# Patient Record
Sex: Male | Born: 1965 | Race: White | Hispanic: No | Marital: Married | Smoking: Never smoker
Health system: Southern US, Community
[De-identification: ages and names within clinical notes are randomized; demographics above are authoritative.]

## PROBLEM LIST (undated history)

## (undated) DIAGNOSIS — M069 Rheumatoid arthritis, unspecified: Secondary | ICD-10-CM

## (undated) HISTORY — DX: Rheumatoid arthritis, unspecified: M06.9

---

## 2007-09-06 HISTORY — PX: ELBOW SURGERY: SHX618

## 2008-01-31 ENCOUNTER — Ambulatory Visit (HOSPITAL_BASED_OUTPATIENT_CLINIC_OR_DEPARTMENT_OTHER): Admission: RE | Admit: 2008-01-31 | Discharge: 2008-01-31 | Payer: Self-pay | Admitting: Orthopedic Surgery

## 2008-01-31 ENCOUNTER — Encounter (INDEPENDENT_AMBULATORY_CARE_PROVIDER_SITE_OTHER): Payer: Self-pay | Admitting: Orthopedic Surgery

## 2011-01-18 NOTE — Op Note (Signed)
NAME:  LENNYN, BELLANCA NO.:  [id number]   MEDICAL RECORD NO.:  [id number]          PATIENT TYPE:  AMB   LOCATION:  DSC                          FACILITY:  [facility]   PHYSICIAN:  Katy Fitch. Sypher, M.D. 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   OPERATION:  1. Resection of large subcutaneous rheumatoid nodule and olecranon      bursa, right elbow.  2. Through separate incision, removal of large olecranon bursa and      rheumatoid nodule, left elbow.   OPERATING SURGEON:  Katy Fitch. Sypher, MD   ASSISTANT:  Marveen Reeks. Dasnoit, PA-C.   ANESTHESIA:  General by LMA.   SUPERVISING ANESTHESIOLOGIST:  Zenon Mayo, MD   INDICATIONS:  Knight Oelkers is a 45 year old gentleman referred through  courtesy of Dr. Syliva Overman for evaluation and management of massive  rheumatoid nodules involving the olecranon bursae bilaterally.   On the right, he had a near softball-sized bursa and rheumatoid nodule  and on the left, he had a nodule, nearly the size of an apple.   This rendered very challenging for Mr. Shave to lean against surfaces  with his arms.   He was on anti-inflammatory medication from Dr. Jimmy Footman, including  Plaquenil and methotrexate.  Overall, his arthritis symptoms were well  controlled on this regimen.   Due to the difficulty with him leaning against his elbows, another  functional impairment, he was brought to the operating room at this time  for removal of his bilateral rheumatoid nodules.   Preoperatively, he was advised of the pathophysiology of these lesions.  He understands that there is a significant chance of recurrence.  Our  goal today is to debulk these and to improve his arm function.   PROCEDURE:  Tanmay Halteman was brought to the operating room and was placed  in supine position up on the operating table.   Following the induction of general anesthesia by LMA technique, the  right and left arms were prepped with Betadine soap solution, sterilely  draped.  A pneumatic tourniquet was applied in the proximal right  brachium and the proximal left brachium.  On exsanguination of the right  arm with an Esmarch bandage and arterial tourniquet, the proximal  brachium was inflated to 130 mmHg.  The procedure commenced with  planning of a 15-cm incision with intention to remove approximately 10  cm2 of skin due to the tissue expansion nature of this large nodules.   The skin was carefully undermined with  a fine tenotomy scissors  developing a plane between the nodule and the dermis.  These were  circumferentially dissected to the fascia overlying the extensor  musculature, triceps, and flexor carpi ulnaris.  The rheumatoid nodules  removed on block and the entire olecranon bursa was removed down to the  level of periosteum of the olecranon.   Bleeding points were electrocauterized with bipolar current under saline  followed by release of the tourniquet and hemostasis.   The wounds were then repaired with subcutaneous suture of 4-0 Vicryl and  intradermal through a Prolene segmental suture with Steri-Strips.  2%  lidocaine was infiltrated postop analgesia.   A compressive dressing was applied with sterile gauze and Tegaderm  followed by Ace wrap for compression.   Attention was then directed to the left arm.  The arm was exsanguinated  with  Esmarch bandage and an arterial tourniquet on the proximal brachium  was inflated to 230 mmHg.  A similar incision was created removing  approximately 8 cm2 of skin to decrease the volume of redundant skin due  to the tissue expansion.   The nodules were dissected between the dermis and the pseudocapsule.  The entire nodule was taken off on block with a portion of the  periosteum overlying the olecranon and the entire olecranon bursa.  Care  was taken to identify the ulnar nerve prior to completion of the medial  dissection.   The wounds were then inspected for bleeding points, which were  electrocauterized under saline followed by repair of the skin with  subcutaneous suture of 4-0 Vicryl and intradermal 3-0 Prolene segmental  sutures.  Steri-Strips were applied followed by infiltration of 2%  lidocaine for postoperative analgesia.   The wounds were then dressed with sterile gauze and Tegaderm followed by  application of an Ace wrap.  There were no apparent complications.   For aftercare, Mr. Schloemer was provided prescription for Percocet 5 mg, 1-2  tablets p.o. q.4-6 hours p.r.n. pain, 30 tablets without refill and also  Keflex 500 mg one p.o. q.8 hours x4 days as a prophylactic antibiotic.      Katy Fitch Sypher, M.D.  Electronically Signed     RVS/MEDQ  D:  01/31/2008  T:  02/01/2008  Job:  [id number]   cc:   Lemmie Evens, M.D.

## 2013-11-01 ENCOUNTER — Ambulatory Visit (INDEPENDENT_AMBULATORY_CARE_PROVIDER_SITE_OTHER): Payer: BC Managed Care – PPO | Admitting: Sports Medicine

## 2013-11-01 ENCOUNTER — Encounter: Payer: Self-pay | Admitting: Sports Medicine

## 2013-11-01 ENCOUNTER — Ambulatory Visit
Admission: RE | Admit: 2013-11-01 | Discharge: 2013-11-01 | Disposition: A | Discharge status: Home / Self Care | Payer: BC Managed Care – PPO | Source: Ambulatory Visit | Attending: Sports Medicine | Admitting: Sports Medicine

## 2013-11-01 VITALS — BP 151/88 | HR 51 | Ht 73.0 in | Wt 215.0 lb

## 2013-11-01 DIAGNOSIS — M25562 Pain in left knee: Secondary | ICD-10-CM | POA: Insufficient documentation

## 2013-11-01 DIAGNOSIS — M25569 Pain in unspecified knee: Secondary | ICD-10-CM

## 2013-11-01 NOTE — Progress Notes (Signed)
Subjective:    Patient ID: Adam Patton, male    DOB: 02-Oct-1965, 49 y.o.   MRN: [id number]  HPI: Adam Patton is a 48yo male runner who presents to [facility] for new evaluation of left medial knee pain for about 3 months (started November 2014). Pt states he noticed medial knee soreness after a run without specific injury around that time. Pt recounts left Achilles sprain in November of 2013 (a year before his current knee pain), which he managed with taking time off from running for about 2 months, as well as massage and stretching therapy, all of which helped. He similarly took about two months off of running when his knee pain started but his knee pain recurred when he tried to start running again twice in January. His pain is described as a pulling, sharp tightness along the inside of his knee and is worse with activity and with pushing off; it has gotten worse to the point that he has had pain with even walking over the last few weeks, especially when walking up an incline. Stretching and massage has helped his pain only temporarily and he has not been able to run. He can bike (normal and stationary) without any knee pain. No swelling or locking of the knee.  Of note, pt has a hx of rheumatoid arthritis diagnosed in his 20's, which is stable and well controlled on Arthrotec, methotrexate, and Plaquenil. He sees Dr. Dierdre Forth (rheumatology) regularly. He has never had knee pain and his current pain as above is very different than the typical stiff soreness he has had in his hands when he has tried to cut back on medication in the past.  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  In addition to the above documentation, pt's PMH, surgical history, FH, and SH all reviewed and updated where appropriate  in the EMR. I have also reviewed and updated the pt's allergies and current medications as appropriate.  Review of Systems: As above per HPI. Otherwise feels well. Specifically denies fever / chills, SOB, N/V. Otherwise full 12-point ROS reviewed and all negative.     Objective:   Physical Exam BP 151/88  Pulse 51  Ht 6\' 1"  (1.854 m)  Wt 215 lb (97.523 kg)  BMI 28.37 kg/m2 Gen: well-appearing adult male in NAD MSK:  Bilateral knees normal to inspection without frank deformity or effusion  No obvious / gross leg-length discrepancy   Mild-moderate tenderness to palpation along medial joint line of left knee  Normal full ROM actively and passively to bilateral knees  No pain with patellar compression, no pes anserine bursae tenderness  No joint laxity with valgus or varus stress on either knee, or with Lachman's or anterior drawer testing  Equivocal McMurray's test on left, POSITIVE medial left knee pain with Thessaly test  Gait / station normal, but pt with reduced ability to bear all weight on left knee secondary to pain Neurovascular:  Alert / oriented, no gross focal deficit, no sensory abnormality of LE bilaterally  Strength 5/5 bilaterally in flexion / extension at knees, dorsi- and plantarflexion  Strength 5/5 with resisted inversion / eversion of feet bilaterally  Strength 5/5 with resisted great toe flexion / extension  Feet warm, well-perfused, DP pulses brisk / equal bilaterally, toe cap  refill normal bilaterally Pulm: normal WOB, pt speaks in full sentences Skin: LE bilaterally without frank rash or skin lesions  Nondiagnostic / Limited US of knee by Dr. Margaretha Sheffieldraper - no discrete peripheral meniscus tearing / fraying, though some increased fluid surrounding left medial meniscus - otherwise no obvious distal femur or proximal tibial bone surface irregularity     Assessment & Plan:  Impression of possible left knee meniscus injury without definite specific injury and no gross  abnormality on limited US by Dr. Margaretha Sheffieldraper as above. Plan to obtain plain films of left knee (STANDING AP, lateral, 30-degree flexion views plus sunrise view) and gave handout / reviewed knee ROM and strength exercises. Also provided left knee compression sleeve. Advised pt to let pain guide activity level. F/u in 3 weeks to re-evaluate or sooner if needed; if worsening or no improvement by that point, would consider injection and further follow-up for consideration of more advanced imaging such as MRI.  The above was discussed in its entirety with attending Dr. Margaretha Sheffieldraper. Bobbye Mortonhristopher M Marshun Duva, MD PGY-2, [facility] 11/01/2013, 12:14 PM

## 2013-11-01 NOTE — Patient Instructions (Signed)
We think you have a medial (inside of the knee) meniscus injury. [facility] We'll get the results and call you. Use the compression sleeve when you exercise and keep it on for an hour after exercise. If you're no better, we'll consider getting an MRI and do an injection. Come back in 3 weeks to see us again, or sooner if you need.

## 2013-11-04 ENCOUNTER — Telehealth: Payer: Self-pay | Admitting: *Deleted

## 2013-11-04 NOTE — Telephone Encounter (Signed)
Message copied by Mora BellmanMARTIN, AMY C on Mon Nov 04, 2013  2:27 PM ------      Message from: Reino BellisRAPER, TIMOTHY R      Created: Mon Nov 04, 2013 12:49 PM      Regarding: xrays       Please call and let him know that his x-rays show only some mild arthritis. I want him to keep his followup appointment with me in a couple of weeks.            ----- Message -----         From: Rad Results In Interface         Sent: 11/01/2013  11:52 AM           To: Ralene Corkimothy R Draper, DO                   ------

## 2013-11-04 NOTE — Telephone Encounter (Signed)
Called pt- gave him message from Dr. Margaretha Sheffieldraper.

## 2013-11-14 ENCOUNTER — Ambulatory Visit: Payer: Self-pay | Admitting: Sports Medicine

## 2013-11-21 ENCOUNTER — Ambulatory Visit (INDEPENDENT_AMBULATORY_CARE_PROVIDER_SITE_OTHER): Payer: BC Managed Care – PPO | Admitting: Sports Medicine

## 2013-11-21 ENCOUNTER — Encounter: Payer: Self-pay | Admitting: Sports Medicine

## 2013-11-21 VITALS — BP 132/82 | Ht 73.0 in | Wt 215.0 lb

## 2013-11-21 DIAGNOSIS — M25562 Pain in left knee: Secondary | ICD-10-CM

## 2013-11-21 DIAGNOSIS — M25569 Pain in unspecified knee: Secondary | ICD-10-CM

## 2013-11-21 NOTE — Progress Notes (Addendum)
   Subjective:    Patient ID: Zebedee IbaGeorge Cassada, male    DOB: 05-Oct-1965, 48 y.o.   MRN: [id number]  HPI  Mr. Ian BushmanHurt presents for follow-up of left knee pain. Knee pain has been going on since November 2014. He now is no longer having pain with going up and down stairs, walking, or biking. He tried to run for 1 minute and the knee pain returned immediately. But as soon as he stopped running it went away. He has been using a compression sleeve but has not noticed a difference in pain. He does not take any pain medicine for his knee. Denies swelling, locking, or buckling. He has also been doing strength and ROM exercises without significant improvement.    Review of Systems Negative apart from HPI     Objective:   Physical Exam  Knee Inspection: no swelling, erythema Palpation: tenderness to palpation along the medial joint line of the LEFT knee ROM: Normal strength and ROM bilaterally Special tests: Pain with medial Thessaly on LEFT. Varus, Valgus, anterior draw were negative.   XRay Left Knee (11/01/2013) FINDINGS: There is only slight loss of medial joint space. No significant  degenerative spurring is seen. No joint effusion is noted and there is no erosion noted. IMPRESSION: Slight loss of medial joint space. No effusion     Assessment & Plan:  48 yo with 4 month history of Left knee pain  1. Left Knee pain: Most likely a meniscal injury given positive Thessaly and only mild arthritis on X-ray --Discussed Steroid shot and MRI as more aggressive approach --Pt decided to avoid activities like running which aggravate the pain and focusing on biking and walking as these do not cause pain --Will think about getting a steroid injection and will call to make a follow-up appointment if he decides he wants to move forward with injection. Otherwise, followup when necessary.  Seen with Howell RucksJessica Rein, MS4

## 2014-01-29 ENCOUNTER — Encounter: Payer: Self-pay | Admitting: Emergency Medicine

## 2014-01-29 ENCOUNTER — Ambulatory Visit (INDEPENDENT_AMBULATORY_CARE_PROVIDER_SITE_OTHER): Payer: BC Managed Care – PPO | Admitting: Emergency Medicine

## 2014-01-29 VITALS — BP 135/81 | Ht 73.0 in | Wt 215.0 lb

## 2014-01-29 DIAGNOSIS — M25562 Pain in left knee: Secondary | ICD-10-CM

## 2014-01-29 DIAGNOSIS — M25569 Pain in unspecified knee: Secondary | ICD-10-CM

## 2014-01-29 MED ORDER — METHYLPREDNISOLONE ACETATE 80 MG/ML IJ SUSP
80.0000 mg | Freq: Once | INTRAMUSCULAR | Status: AC
  Administered 2014-01-29: 80 mg via INTRA_ARTICULAR

## 2014-01-29 NOTE — Assessment & Plan Note (Signed)
Patient corticosteroid injection done today. He'll followup as needed.

## 2014-01-29 NOTE — Progress Notes (Signed)
Patient ID: Adam Patton, male   DOB: 05/13/1966, 48 y.o.   MRN: [id number] Patient with a history of left knee pain in the medial aspect of his knee who is an avid runner and cyclist presents for corticosteroid injection into his knee. He's had long-standing progressive worsening medial knee pain which is exacerbated by activity and has limited his ability to run over the past 4 months. He was seen by Dr. Margaretha Sheffield in March and the potential for an MRI with consideration of arthroscopy versus corticosteroid injection therapy versus conservative treatment were discussed. The patient initially elected for conservative treatment the presents today requesting corticosteroid injection to see if this will help improve his symptoms. His pain has not worsened however it has not improved significantly.  Examination BP 135/81  Ht 6\' 1"  (1.854 m)  Wt 215 lb (97.523 kg)  BMI 28.37 kg/m2 Left knee: No obvious effusion No erythema Full range of motion with 5 over 5 strength No ligamentous laxity to varus or valgus stress Intact Lachman and anterior drawer Tenderness to palpation of the medial joint line Positive McMurray medially  Procedure:  Injection of left knee Consent obtained and verified. Time-out conducted. Noted no overlying erythema, induration, or other signs of local infection. Skin prepped in a sterile fashion. Topical analgesic spray: Ethyl chloride. Completed without difficulty. Meds: 80mg  dep and 4 cc lidocaine Pain immediately improved suggesting accurate placement of the medication. Advised to call if fevers/chills, erythema, induration, drainage, or persistent bleeding.

## 2014-11-29 IMAGING — CR DG KNEE COMPLETE 4+V*L*
4 series · 4 of 4 positions shown · non-contrast
Comparison: None.

CLINICAL DATA: Left knee pain for more than 1 year, history of
rheumatoid arthritis

EXAM:
LEFT KNEE - COMPLETE 4+ VIEW

[w knee ap left (1 of 2)]
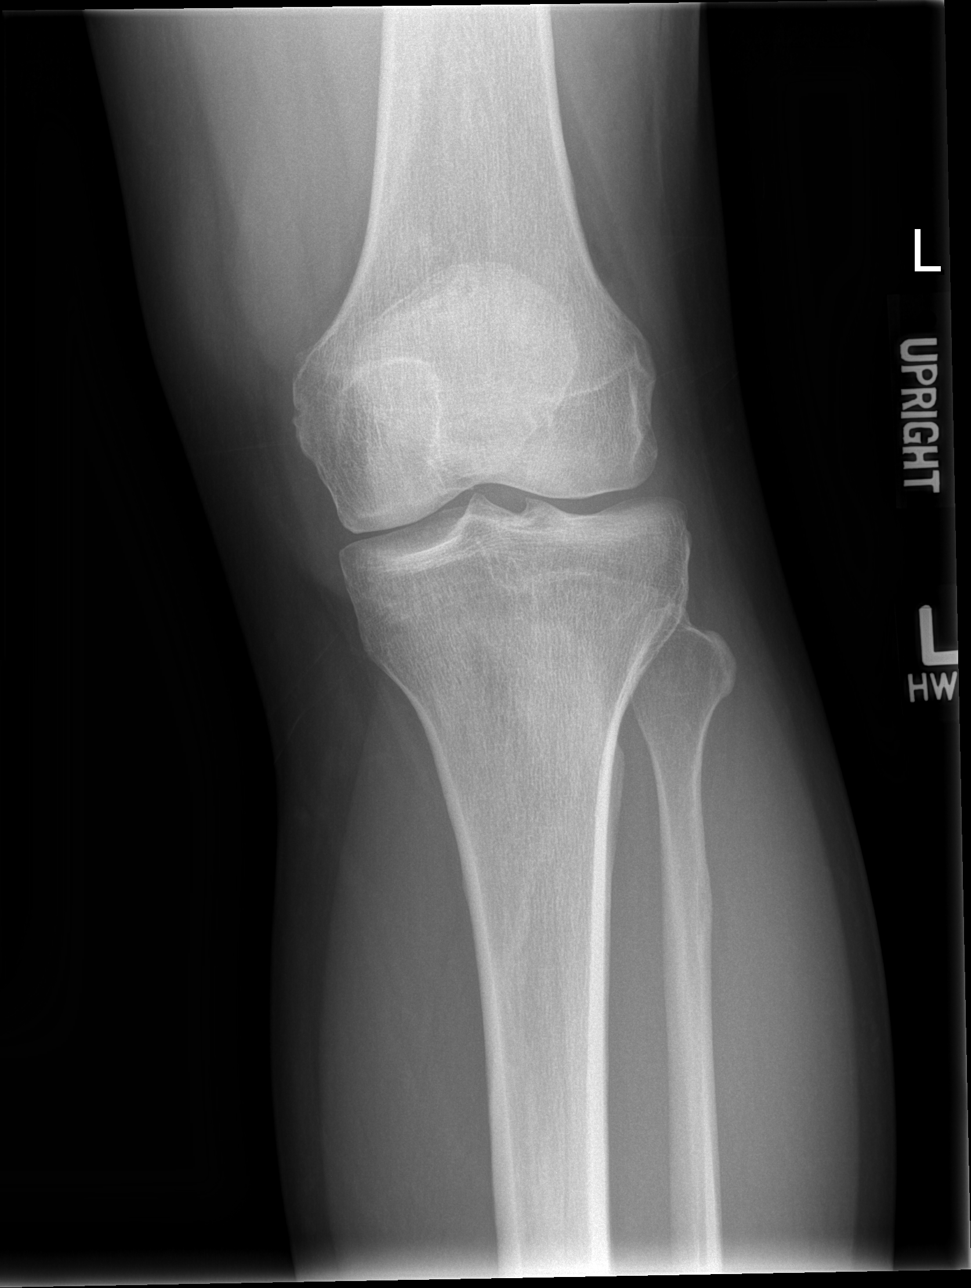

[w knee ap left (2 of 2)]
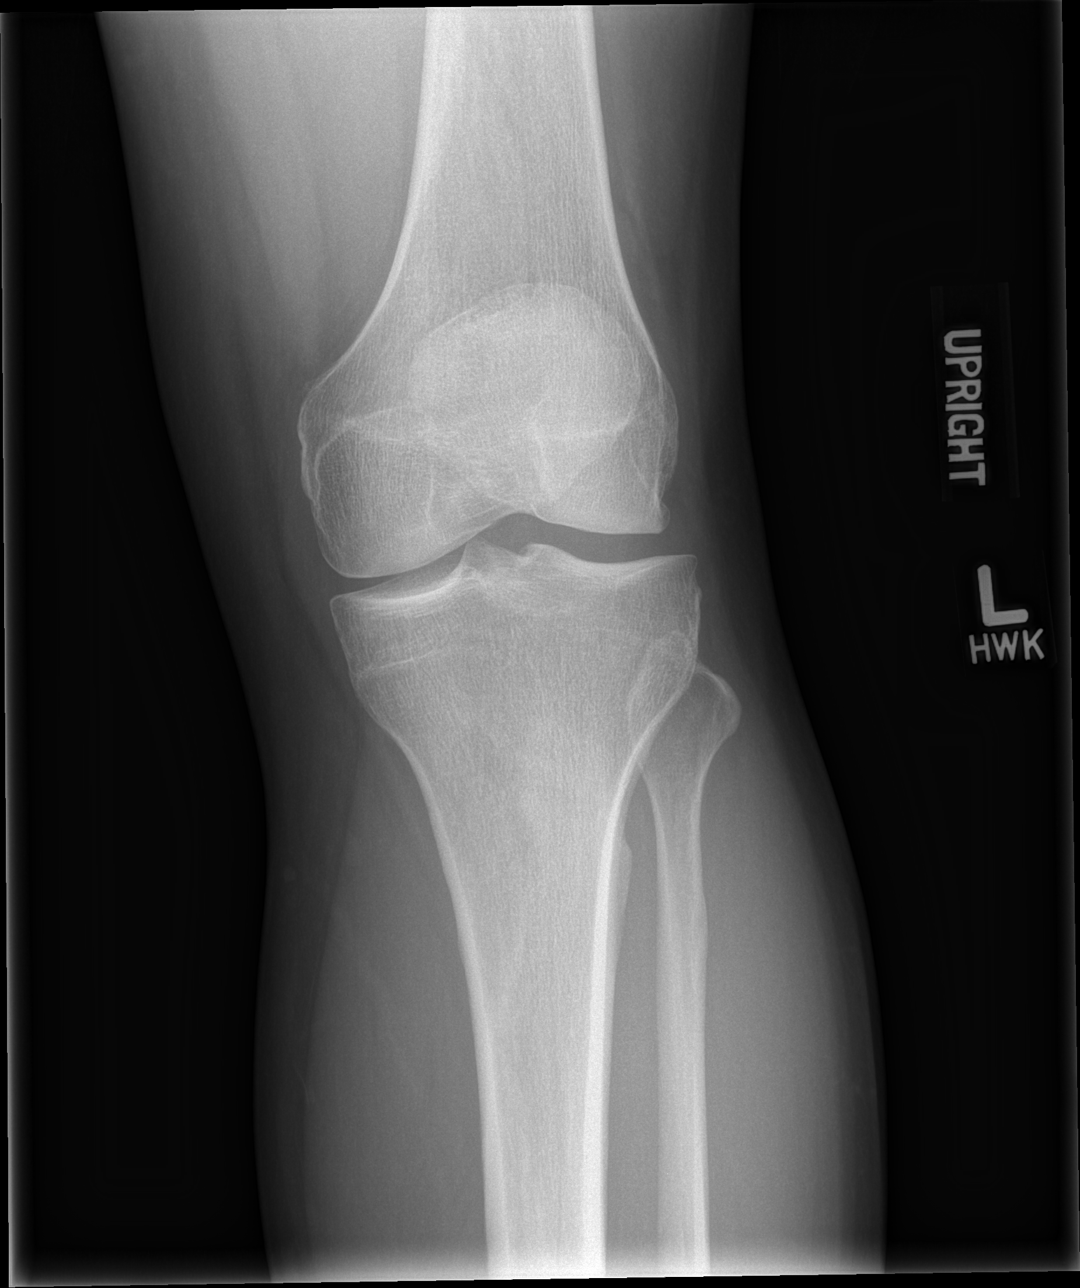

[w knee lat. left]
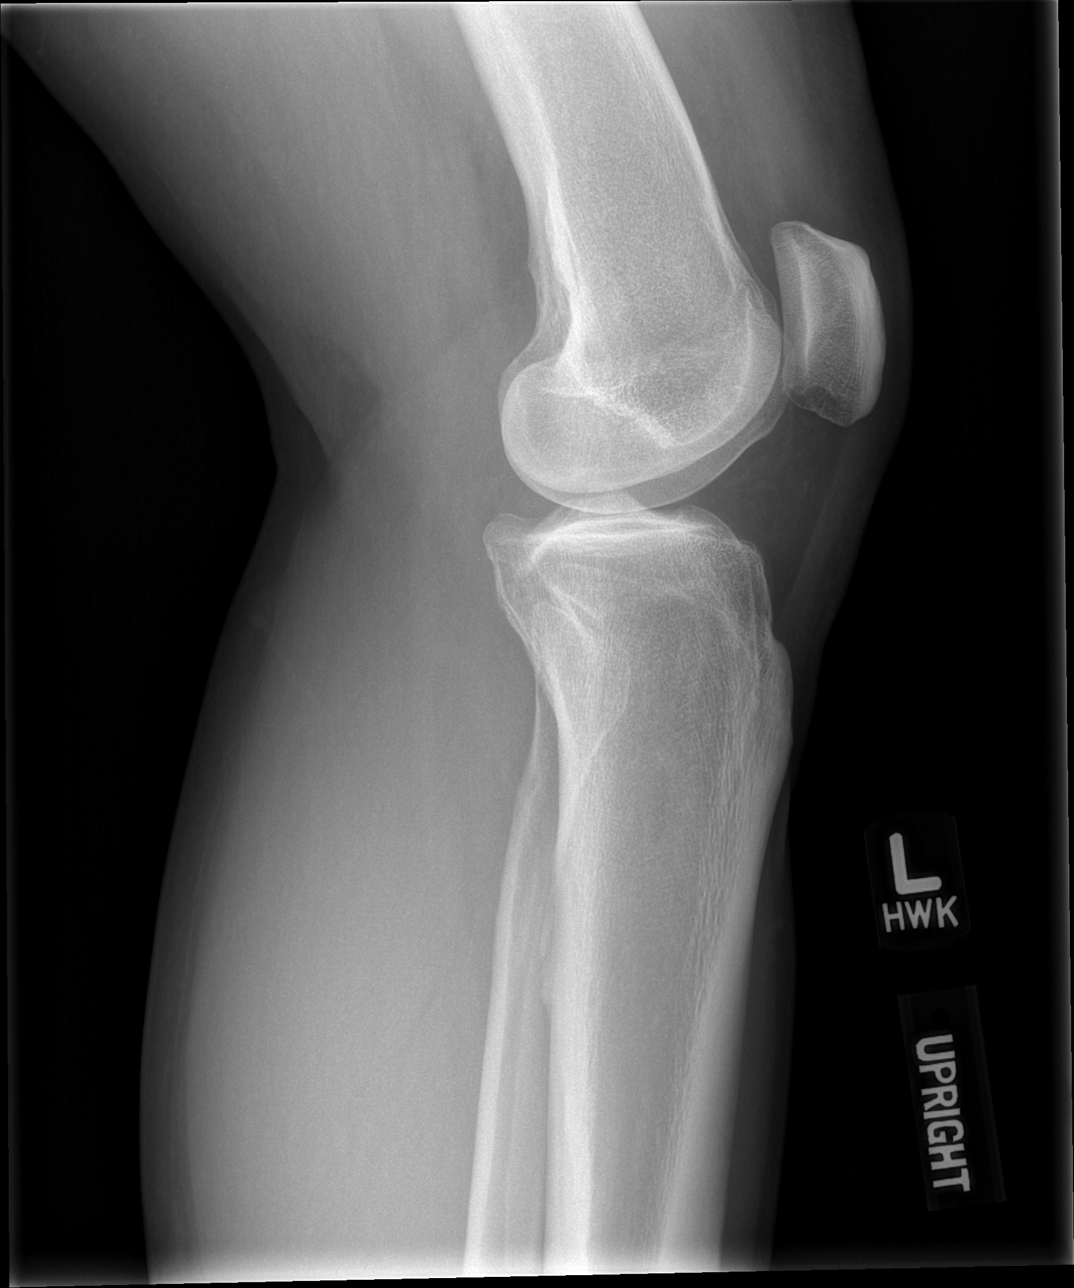

[view not recorded]
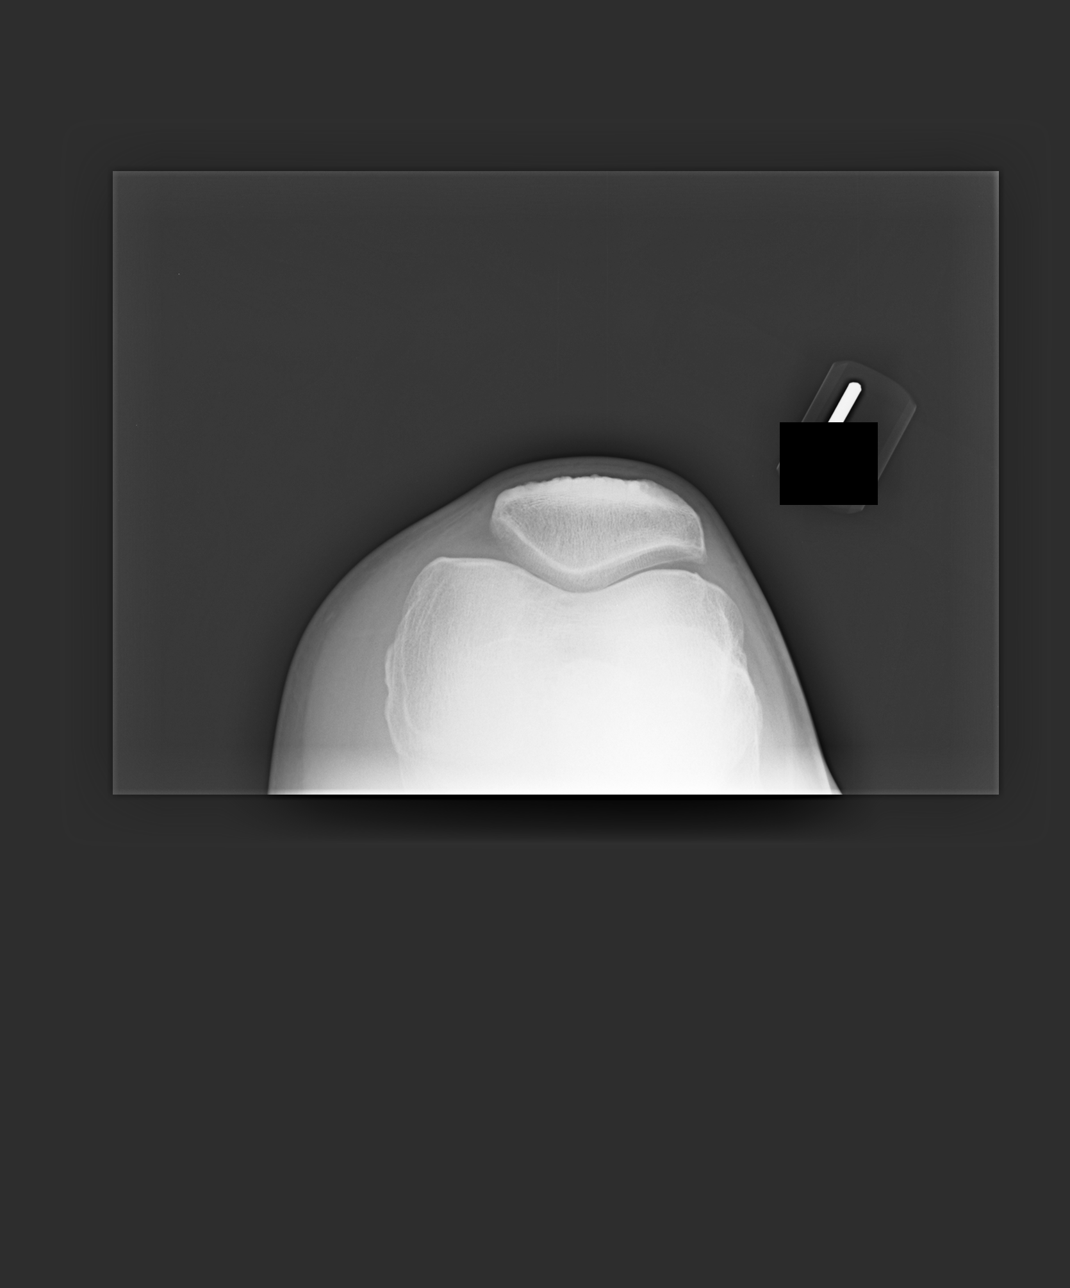

[4 of 4 positions shown; findings below may reference images not displayed]

FINDINGS: There is only slight loss of medial joint space. No significant
degenerative spurring is seen. No joint effusion is noted and there
is no erosion noted.
IMPRESSION: Slight loss of medial joint space.  No effusion.
